# Patient Record
Sex: Male | Born: 1985 | Race: White | Hispanic: No | Marital: Single | Smoking: Never smoker
Health system: Southern US, Community
[De-identification: ages and names within clinical notes are randomized; demographics above are authoritative.]

---
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
## 2020-10-21 ENCOUNTER — Ambulatory Visit (INDEPENDENT_AMBULATORY_CARE_PROVIDER_SITE_OTHER): Payer: BC Managed Care – PPO

## 2020-10-21 ENCOUNTER — Other Ambulatory Visit: Payer: Self-pay

## 2020-10-21 ENCOUNTER — Encounter (HOSPITAL_COMMUNITY): Payer: Self-pay | Admitting: Emergency Medicine

## 2020-10-21 ENCOUNTER — Ambulatory Visit (HOSPITAL_COMMUNITY)
Admission: EM | Admit: 2020-10-21 | Discharge: 2020-10-21 | Disposition: A | Discharge status: Home / Self Care | Payer: BC Managed Care – PPO | Attending: Family Medicine | Admitting: Family Medicine

## 2020-10-21 DIAGNOSIS — R109 Unspecified abdominal pain: Secondary | ICD-10-CM | POA: Diagnosis not present

## 2020-10-21 DIAGNOSIS — M549 Dorsalgia, unspecified: Secondary | ICD-10-CM | POA: Diagnosis not present

## 2020-10-21 DIAGNOSIS — R319 Hematuria, unspecified: Secondary | ICD-10-CM

## 2020-10-21 DIAGNOSIS — N200 Calculus of kidney: Secondary | ICD-10-CM

## 2020-10-21 LAB — POCT URINALYSIS DIPSTICK, ED / UC
Bilirubin Urine: NEGATIVE
Glucose, UA: NEGATIVE mg/dL
Ketones, ur: NEGATIVE mg/dL
Nitrite: NEGATIVE
Protein, ur: NEGATIVE mg/dL
Specific Gravity, Urine: <=1.005 (ref 1.005–1.030)
Urobilinogen, UA: 0.2 mg/dL (ref 0.0–1.0)
pH: 6 (ref 5.0–8.0)

## 2020-10-21 MED ORDER — KETOROLAC TROMETHAMINE 60 MG/2ML IM SOLN
60.0000 mg | Freq: Once | INTRAMUSCULAR | Status: AC
  Administered 2020-10-21: 60 mg via INTRAMUSCULAR

## 2020-10-21 MED ORDER — KETOROLAC TROMETHAMINE 60 MG/2ML IM SOLN
INTRAMUSCULAR | Status: AC
  Filled 2020-10-21: qty 2

## 2020-10-21 MED ORDER — TAMSULOSIN HCL 0.4 MG PO CAPS: 0.4000 mg | ORAL_CAPSULE | Freq: Every day | ORAL | 0 refills | Status: AC

## 2020-10-21 MED ORDER — KETOROLAC TROMETHAMINE 10 MG PO TABS: 10.0000 mg | ORAL_TABLET | Freq: Four times a day (QID) | ORAL | 0 refills | Status: AC | PRN

## 2020-10-21 NOTE — ED Triage Notes (Addendum)
Pt presents with mid back pain xs 3-4 days. Hot bath, ibuprofen given temporary relief. States has stomach pain on and off.   Pt states about 2-3 weeks ago had COVID like symptoms, but tested negative with at home rapid test. States back/stomach pain started after sickness.

## 2020-10-21 NOTE — Discharge Instructions (Addendum)
Take Toradol every 6 hours as needed for pain.  You received a Toradol injection here today so therefore you will not need another dose of Toradol for another 8 to 10 hours. Start Flomax once daily with dinner to improve urine flow and drink plenty of fluids to facilitate passage of renal stone. Contact [facility] today to schedule follow-up evaluation.

## 2020-10-21 NOTE — ED Provider Notes (Signed)
[facility]    CSN: [id number] Arrival date & time: 10/21/20  0086      History   Chief Complaint Chief Complaint  Patient presents with  . Back Pain    HPI Louis Reed is a 34 y.o. male.   HPI  Patient presents today with bilateral flank pain with chills which has been present for over the course of the last week. Patient has also had some cramping type generalized abdominal pain which has subsided.  His greatest concern is his bilateral flank pain which is tolerable at times, although continues to progress with intensity over the last week. He is denies dysuria, nausea,  or vomiting.   History reviewed. No pertinent past medical history.  There are no problems to display for this patient.   History reviewed. No pertinent surgical history.     Home Medications    Prior to Admission medications   Not on File    Family History Family History  Problem Relation Age of Onset  . Healthy Mother   . Healthy Father     Social History Social History   Tobacco Use  . Smoking status: Never Smoker  . Smokeless tobacco: Never Used  Substance Use Topics  . Alcohol use: Yes  . Drug use: Not on file     Allergies   Patient has no allergy information on record.   Review of Systems Review of Systems Pertinent negatives listed in HPI  Physical Exam Triage Vital Signs ED Triage Vitals  Enc Vitals Group     BP 10/21/20 0910 (!) 141/90     Pulse Rate 10/21/20 0910 (!) 106     Resp 10/21/20 0910 18     Temp 10/21/20 0910 99.4 F (37.4 C)     Temp Source 10/21/20 0910 Oral     SpO2 10/21/20 0910 100 %     Weight --      Height --      Head Circumference --      Peak Flow --      Pain Score 10/21/20 0906 3     Pain Loc --      Pain Edu? --      Excl. in GC? --    No data found.  Updated Vital Signs BP (!) 141/90 (BP Location: Right Arm)   Pulse (!) 106   Temp 99.4 F (37.4 C) (Oral)   Resp 18   SpO2 100%   Visual Acuity Right Eye  Distance:   Left Eye Distance:   Bilateral Distance:    Right Eye Near:   Left Eye Near:    Bilateral Near:     Physical Exam Constitutional:      Appearance: He is ill-appearing.     Comments: Mild distress d/t pain  Cardiovascular:     Rate and Rhythm: Normal rate and regular rhythm.  Pulmonary:     Effort: Pulmonary effort is normal.     Breath sounds: Normal breath sounds.  Abdominal:     Tenderness: There is right CVA tenderness and left CVA tenderness.  Musculoskeletal:     Cervical back: Normal range of motion and neck supple.  Skin:    General: Skin is warm.     Capillary Refill: Capillary refill takes less than 2 seconds.  Neurological:     General: No focal deficit present.     Mental Status: He is alert and oriented to person, place, and time.  Psychiatric:  Mood and Affect: Mood normal.        Behavior: Behavior normal.        Thought Content: Thought content normal.        Judgment: Judgment normal.      UC Treatments / Results  Labs (all labs ordered are listed, but only abnormal results are displayed) Labs Reviewed - No data to display  EKG   Radiology No results found.  Procedures Procedures (including critical care time)  Medications Ordered in UC Medications  ketorolac (TORADOL) injection 60 mg (60 mg Intramuscular Given 10/21/20 1106)    Initial Impression / Assessment and Plan / UC Course  I have reviewed the triage vital signs and the nursing notes.  Pertinent labs & imaging results that were available during my care of the patient were reviewed by me and considered in my medical decision making (see chart for details).     Acute left nephrolithiasis, 3 mm (approximately). Flomax started and strainer provided. Toradol PRN for pain. Follow-up with information provided to scheduled an appointment with their office. Final Clinical Impressions(s) / UC Diagnoses   Final diagnoses:  Flank pain, Bilateral  Hematuria, unspecified  type  Left nephrolithiasis     Discharge Instructions     Take Toradol every 6 hours as needed for pain.  You received a Toradol injection here today so therefore you will not need another dose of Toradol for another 8 to 10 hours. Start Flomax once daily with dinner to improve urine flow and drink plenty of fluids to facilitate passage of renal stone. Contact [facility] today to schedule follow-up evaluation.    ED Prescriptions    Medication Sig Dispense Auth. Provider   tamsulosin (FLOMAX) 0.4 MG CAPS capsule Take 1 capsule (0.4 mg total) by mouth daily after supper. 30 capsule Bing Neighbors, FNP   ketorolac (TORADOL) 10 MG tablet Take 1 tablet (10 mg total) by mouth every 6 (six) hours as needed. 20 tablet Bing Neighbors, FNP     PDMP not reviewed this encounter.   Bing Neighbors, FNP 10/22/20 2350

## 2021-04-04 IMAGING — DX DG ABDOMEN 1V
1 series · 1 of 1 positions shown · non-contrast
Comparison: None.

CLINICAL DATA: Hematuria by UA.  Bilateral flank pain

EXAM:
ABDOMEN - 1 VIEW

[abdomen kub]
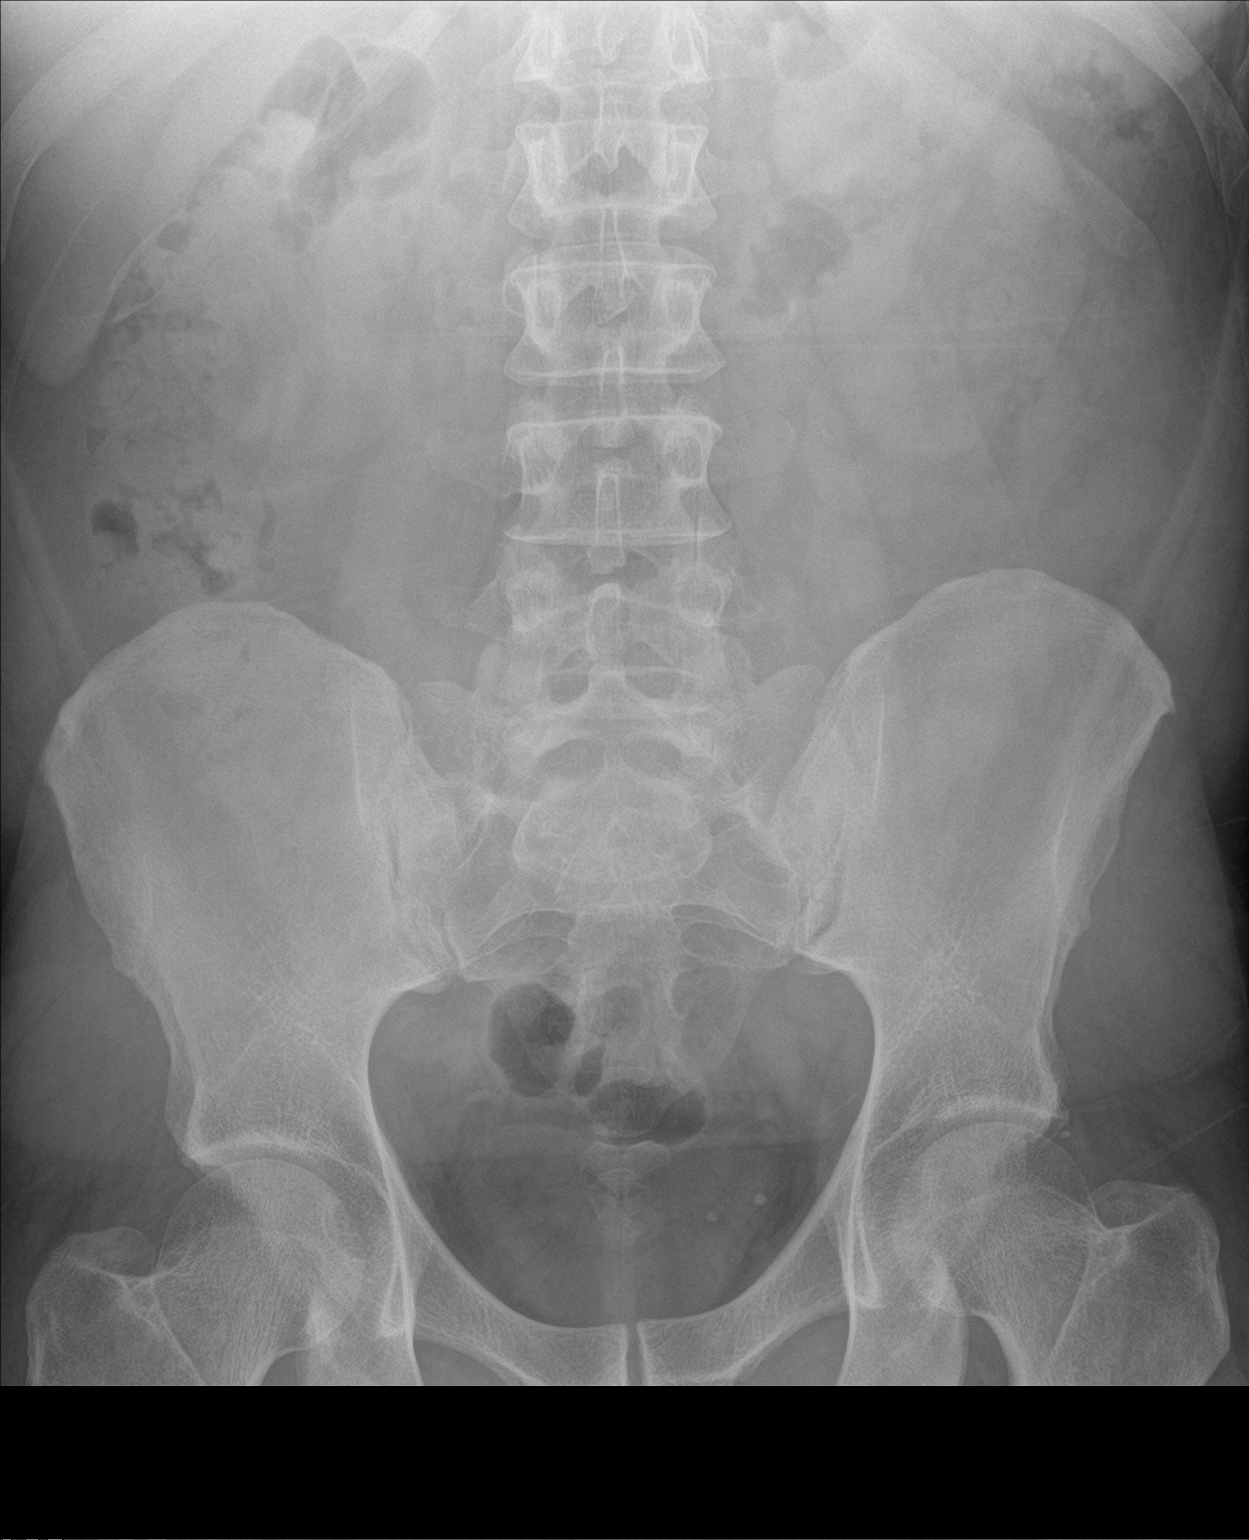

[1 of 1 positions shown; findings below may reference images not displayed]

FINDINGS: Possible stone along the high left flank at the level of L3,
measuring 3-4 mm. 2 left pelvic calcifications which are rounded and
most likely phleboliths. No available comparison.

Normal bowel gas pattern.  Unremarkable osseous structures.
IMPRESSION: Possible 3 mm left upper flank calculus which would localize to the
proximal ureter.

Two left pelvic calcifications favoring phleboliths.
